# Patient Record
Sex: Female | Born: 1985 | Race: Asian | Hispanic: No | Marital: Single | Smoking: Never smoker
Health system: Southern US, Community
[De-identification: ages and names within clinical notes are randomized; demographics above are authoritative.]

## PROBLEM LIST (undated history)

## (undated) DIAGNOSIS — B002 Herpesviral gingivostomatitis and pharyngotonsillitis: Secondary | ICD-10-CM

## (undated) HISTORY — DX: Herpesviral gingivostomatitis and pharyngotonsillitis: B00.2

---

## 2007-12-31 ENCOUNTER — Ambulatory Visit (HOSPITAL_COMMUNITY): Admission: RE | Admit: 2007-12-31 | Discharge: 2007-12-31 | Payer: Self-pay | Admitting: Occupational Medicine

## 2008-10-27 DIAGNOSIS — B002 Herpesviral gingivostomatitis and pharyngotonsillitis: Secondary | ICD-10-CM

## 2008-10-27 HISTORY — DX: Herpesviral gingivostomatitis and pharyngotonsillitis: B00.2

## 2009-03-24 IMAGING — CR DG CHEST 2V
2 series · 2 of 2 positions shown · non-contrast
Comparison: None.

CLINICAL DATA: Positive PPD

[view not recorded (1 of 2)]
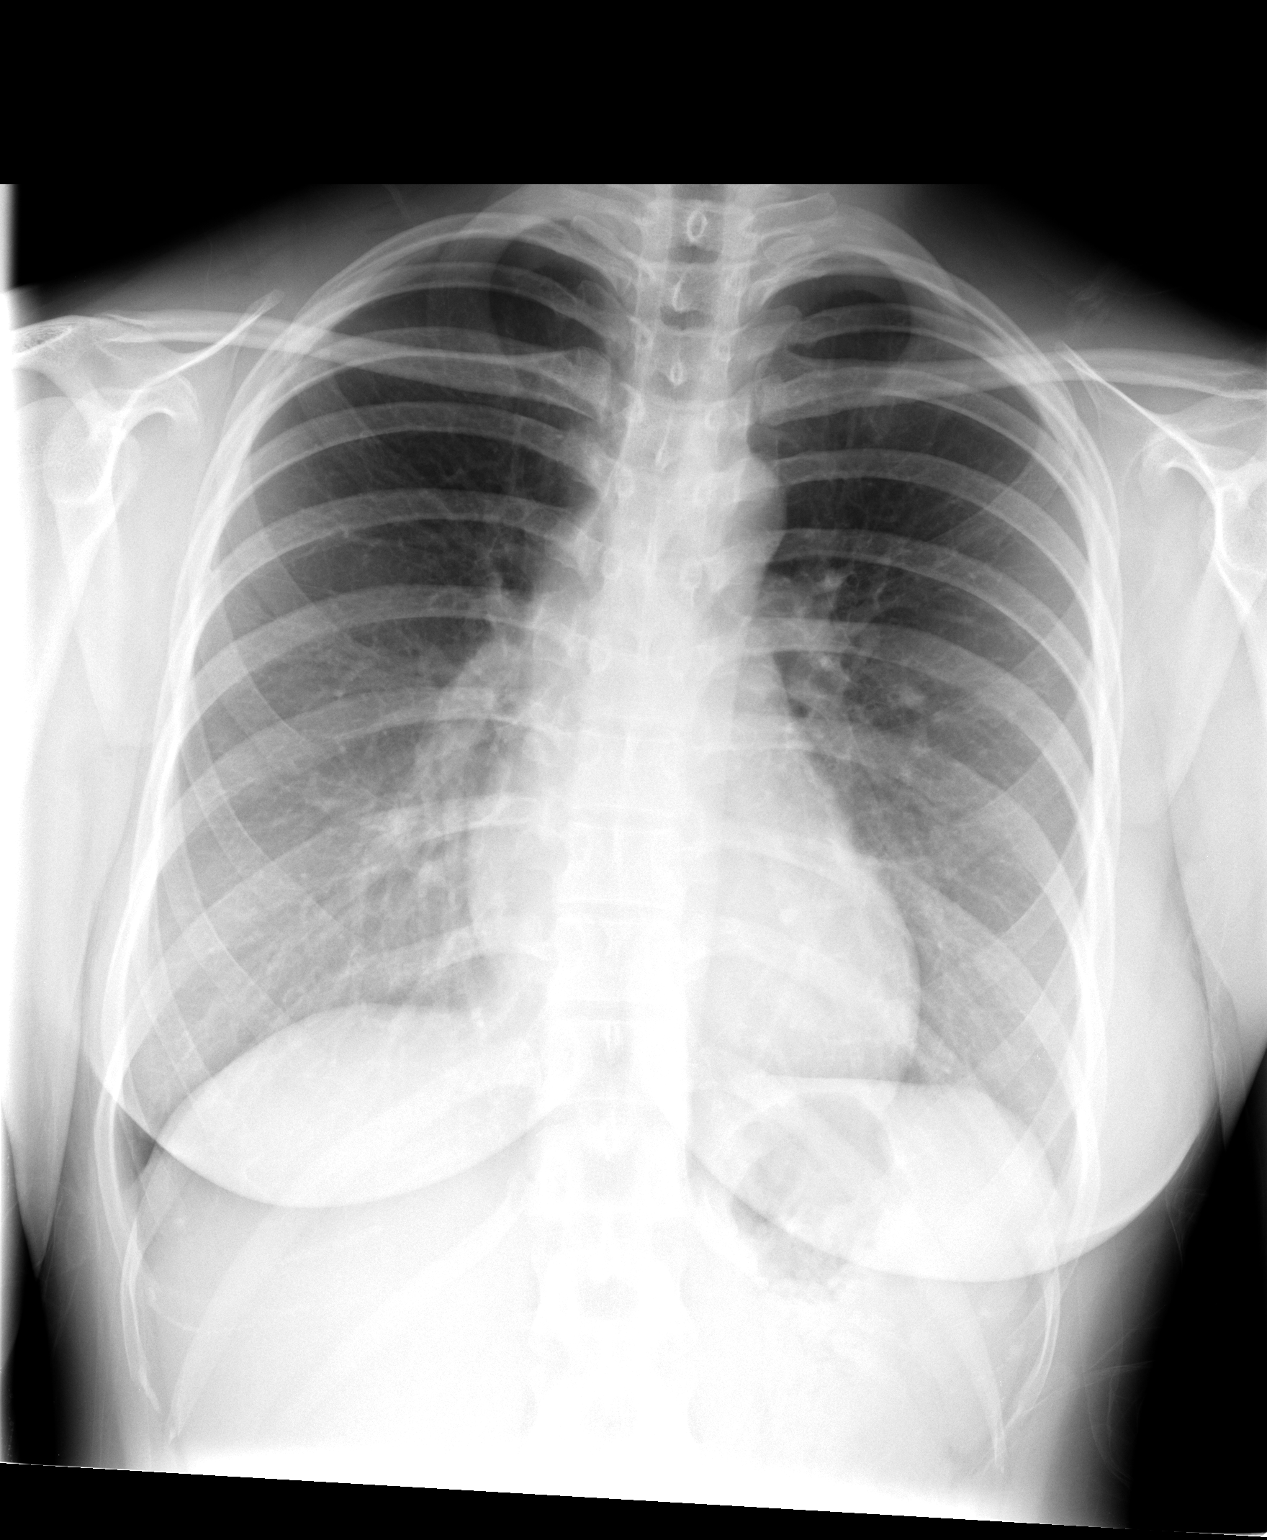

[view not recorded (2 of 2)]
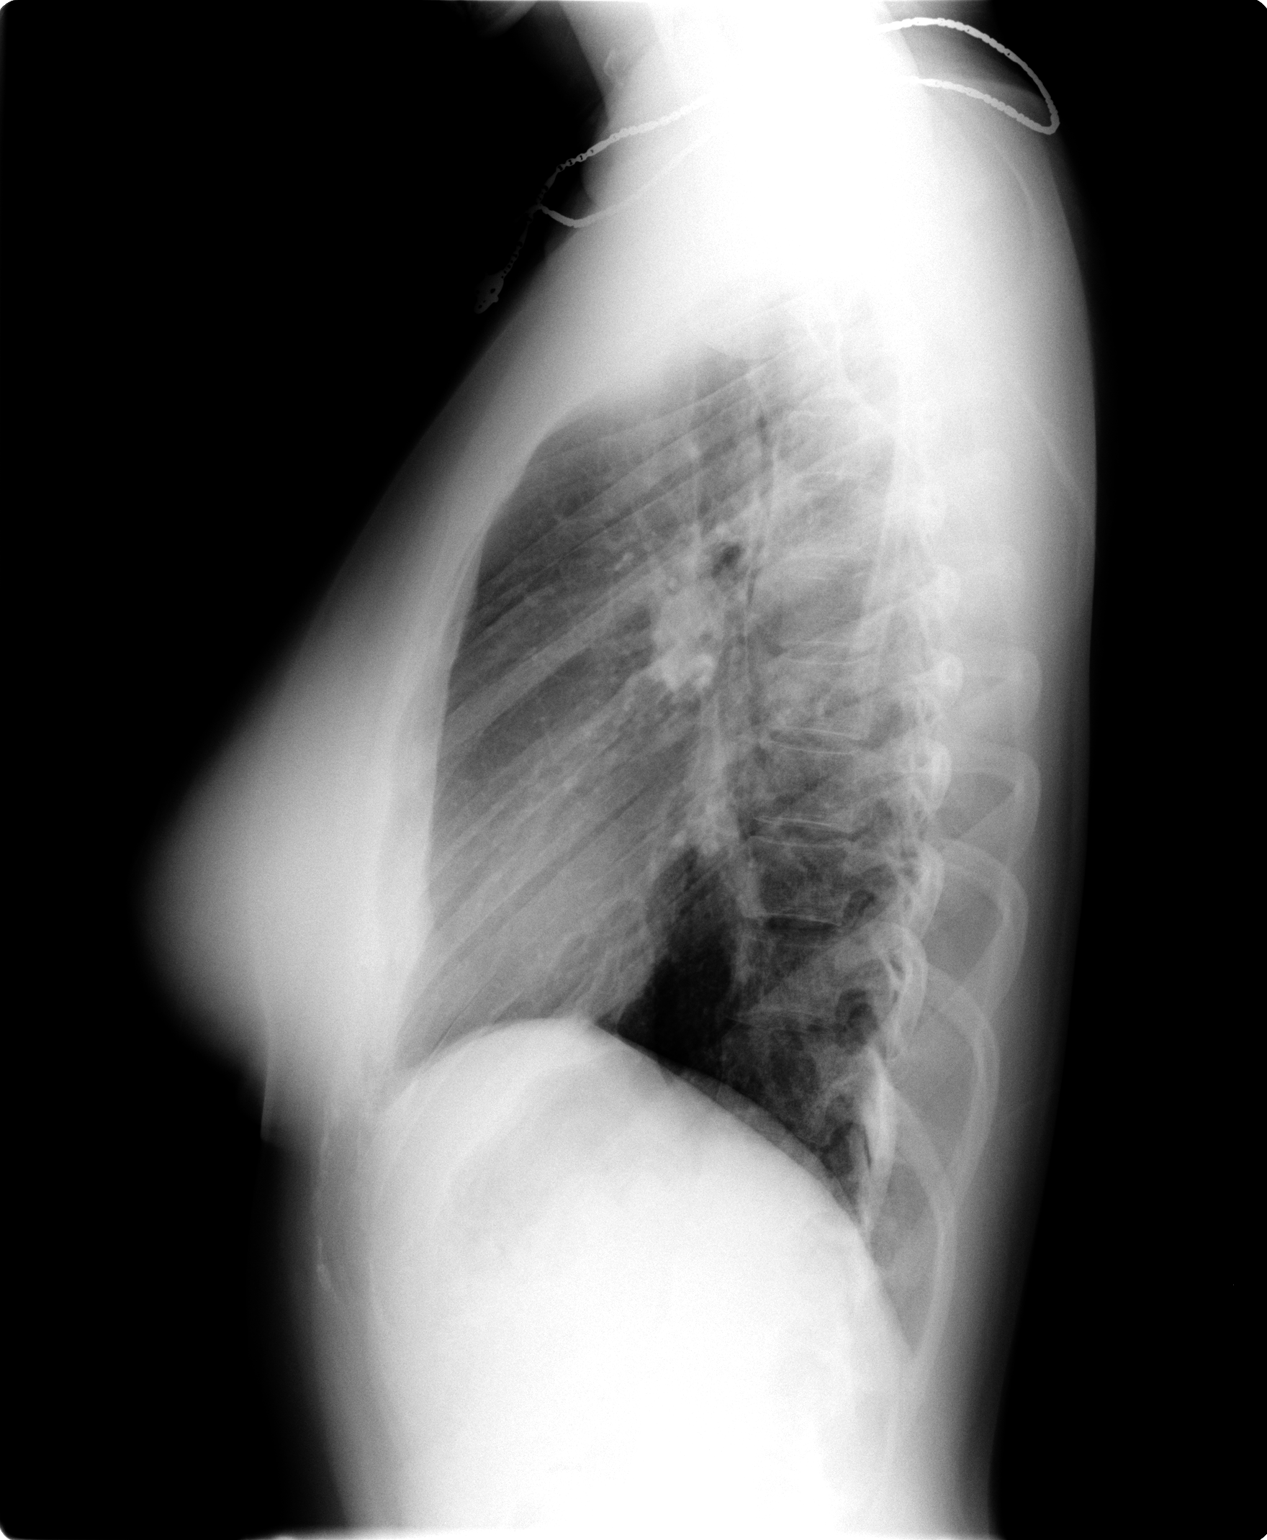

[2 of 2 positions shown; findings below may reference images not displayed]

CHEST - 2 VIEW:

The lungs are clear.  The cardiopericardial silhouette is within normal limits
for size.  Visualized bony structures of the thorax are intact.
IMPRESSION: No acute cardiopulmonary process

## 2010-04-15 ENCOUNTER — Ambulatory Visit: Payer: Self-pay | Admitting: Internal Medicine

## 2010-04-15 DIAGNOSIS — J069 Acute upper respiratory infection, unspecified: Secondary | ICD-10-CM | POA: Insufficient documentation

## 2010-04-16 ENCOUNTER — Telehealth: Payer: Self-pay | Admitting: Internal Medicine

## 2010-07-08 ENCOUNTER — Telehealth: Payer: Self-pay | Admitting: Internal Medicine

## 2010-11-26 NOTE — Progress Notes (Signed)
       New/Updated Medications: ACYCLOVIR 800 MG TABS (ACYCLOVIR) One three times a day for 10 days Prescriptions: ACYCLOVIR 800 MG TABS (ACYCLOVIR) One three times a day for 10 days  #30 x 11   Entered and Authorized by:   Etta Grandchild MD   Signed by:   Etta Grandchild MD on 07/08/2010   Method used:   Electronically to        [facility] [address] (retail)       [phone]       [place], [place]  [address]       Ph: [phone]       Fax: [phone]   RxID:   [id number]

## 2010-11-26 NOTE — Progress Notes (Signed)
  Phone Note Call from Patient   Caller: Patient Summary of Call: Patient c/o continued congestion, stuffiness, when now yellow mucus. She has taken zrytec and had steroid injection at last ov. PLease advise.Marland KitchenMarland KitchenMarland KitchenAlvy Beal Archie CMA  April 16, 2010 10:51 AM   Follow-up for Phone Call        Pt notified.Alvy Beal Archie CMA  April 16, 2010 12:52 PM     New/Updated Medications: AVELOX 400 MG TABS (MOXIFLOXACIN HCL) One by mouth once daily for 7 days Prescriptions: AVELOX 400 MG TABS (MOXIFLOXACIN HCL) One by mouth once daily for 7 days  #7 x 0   Entered and Authorized by:   Etta Grandchild MD   Signed by:   Etta Grandchild MD on 04/16/2010   Method used:   Samples Given   RxID:   [id number]

## 2010-11-26 NOTE — Assessment & Plan Note (Signed)
Summary: CONGESTION/NWS   Vital Signs:  Patient profile:   25 year old female Height:      62 inches Weight:      139.50 pounds BMI:     25.61 O2 Sat:      99 % on Room air Temp:     98.0 degrees F oral Pulse rate:   87 / minute Pulse rhythm:   regular Resp:     16 per minute BP sitting:   128 / 80  (left arm) Cuff size:   large  Vitals Entered By: Rock Nephew CMA (April 15, 2010 1:05 PM)  Nutrition Counseling: Patient's BMI is greater than 25 and therefore counseled on weight management options.  O2 Flow:  Room air CC: congestion, stuffy nose, Bilateral ear pressure x 04/12/2010, URI symptoms Is Patient Diabetic? No Pain Assessment Patient in pain? no        Primary Care Provider:  Etta Grandchild MD  CC:  congestion, stuffy nose, Bilateral ear pressure x 04/12/2010, and URI symptoms.  History of Present Illness:  URI Symptoms      This is a right-handed patient who presents with URI symptoms.  The symptoms began 4 days ago.  The severity is described as moderate.  The patient reports nasal congestion and clear nasal discharge, but denies purulent nasal discharge, sore throat, dry cough, productive cough, earache, and sick contacts.  The patient denies fever, stiff neck, dyspnea, wheezing, rash, vomiting, diarrhea, use of an antipyretic, and response to antipyretic.  The patient also reports itchy throat and sneezing.  The patient denies headache, muscle aches, and severe fatigue.  The patient denies the following risk factors for Strep sinusitis: unilateral facial pain, unilateral nasal discharge, poor response to decongestant, double sickening, tooth pain, Strep exposure, tender adenopathy, and absence of cough.    Preventive Screening-Counseling & Management  Alcohol-Tobacco     Alcohol drinks/day: 0     Smoking Status: never  Caffeine-Diet-Exercise     Does Patient Exercise: no  Hep-HIV-STD-Contraception     Hepatitis Risk: no risk noted     HIV Risk: no risk  noted     STD Risk: no risk noted      Sexual History:  currently monogamous.        Drug Use:  no.        Blood Transfusions:  no.    Medications Prior to Update: 1)  None  Current Medications (verified): 1)  Loestrin 24 Fe 1-20 Mg-Mcg Tabs (Norethin Ace-Eth Estrad-Fe) 2)  Zyrtec-D Allergy & Congestion 5-120 Mg Xr12h-Tab (Cetirizine-Pseudoephedrine) .... One By Mouth Two Times A Day As Needed For Nasal Congestion  Allergies (verified): No Known Drug Allergies  Past History:  Past Medical History: Unremarkable  Past Surgical History: Denies surgical history  Family History: none  Social History: Occupation: L[employer] Single Never Smoked Alcohol use-no Drug use-no Regular exercise-no Smoking Status:  never Hepatitis Risk:  no risk noted HIV Risk:  no risk noted STD Risk:  no risk noted Sexual History:  currently monogamous Blood Transfusions:  no Drug Use:  no Does Patient Exercise:  no  Review of Systems  The patient denies anorexia, fever, weight loss, decreased hearing, hoarseness, chest pain, peripheral edema, prolonged cough, headaches, hemoptysis, abdominal pain, suspicious skin lesions, and enlarged lymph nodes.    Physical Exam  General:  alert, well-developed, well-nourished, well-hydrated, appropriate dress, normal appearance, healthy-appearing, cooperative to examination, and good hygiene.   Head:  normocephalic, atraumatic, no abnormalities observed, and no  abnormalities palpated.   Eyes:  vision grossly intact, no injection, and no nystagmus.   Ears:  R ear normal and L ear normal.   Nose:  no external deformity, no nasal discharge, no airflow obstruction, no intranasal foreign body, no nasal polyps, no nasal mucosal lesions, no mucosal friability, no active bleeding or clots, no sinus percussion tenderness, no septum abnormalities, mucosal erythema, and mucosal edema.   Mouth:  Oral mucosa and oropharynx without lesions or exudates.  Teeth in good  repair. Neck:  supple, full ROM, no masses, no thyromegaly, no thyroid nodules or tenderness, no JVD, normal carotid upstroke, no carotid bruits, no cervical lymphadenopathy, and no neck tenderness.   Lungs:  normal respiratory effort, no intercostal retractions, no accessory muscle use, normal breath sounds, no dullness, no fremitus, no crackles, and no wheezes.   Heart:  normal rate, regular rhythm, no murmur, no gallop, no rub, and no JVD.   Abdomen:  soft, non-tender, normal bowel sounds, no distention, no masses, no guarding, no rigidity, no rebound tenderness, no abdominal hernia, no inguinal hernia, no hepatomegaly, and no splenomegaly.   Msk:  normal ROM, no joint tenderness, no joint swelling, no joint warmth, no redness over joints, no joint deformities, no joint instability, no crepitation, and no muscle atrophy.   Pulses:  R and L carotid,radial,femoral,dorsalis pedis and posterior tibial pulses are full and equal bilaterally Extremities:  No clubbing, cyanosis, edema, or deformity noted with normal full range of motion of all joints.   Neurologic:  No cranial nerve deficits noted. Station and gait are normal. Plantar reflexes are down-going bilaterally. DTRs are symmetrical throughout. Sensory, motor and coordinative functions appear intact. Skin:  turgor normal, color normal, no rashes, no suspicious lesions, no ecchymoses, no petechiae, no purpura, no ulcerations, and no edema.   Cervical Nodes:  no anterior cervical adenopathy and no posterior cervical adenopathy.   Axillary Nodes:  no R axillary adenopathy and no L axillary adenopathy.   Inguinal Nodes:  no R inguinal adenopathy and no L inguinal adenopathy.   Psych:  Cognition and judgment appear intact. Alert and cooperative with normal attention span and concentration. No apparent delusions, illusions, hallucinations   Impression & Recommendations:  Problem # 1:  URI (ICD-465.9) Assessment New  this sounds viral so will treat  symptomatically Her updated medication list for this problem includes:    Zyrtec-d Allergy & Congestion 5-120 Mg Xr12h-tab (Cetirizine-pseudoephedrine) ..... One by mouth two times a day as needed for nasal congestion  Instructed on symptomatic treatment. Call if symptoms persist or worsen.   Complete Medication List: 1)  Loestrin 24 Fe 1-20 Mg-mcg Tabs (Norethin ace-eth estrad-fe) 2)  Zyrtec-d Allergy & Congestion 5-120 Mg Xr12h-tab (Cetirizine-pseudoephedrine) .... One by mouth two times a day as needed for nasal congestion  Patient Instructions: 1)  Please schedule a follow-up appointment in 2 weeks. 2)  Get plenty of rest, drink lots of clear liquids, and use Tylenol or Ibuprofen for fever and comfort. Return in 7-10 days if you're not better:sooner if you're feeling worse. 3)  Take over the counter cough and cold medications only as directed on the package insert. DO NOT take more than recommended . Prescriptions: ZYRTEC-D ALLERGY & CONGESTION 5-120 MG XR12H-TAB (CETIRIZINE-PSEUDOEPHEDRINE) One by mouth two times a day as needed for nasal congestion  #60 x 1   Entered and Authorized by:   Etta Grandchild MD   Signed by:   Etta Grandchild MD on 04/15/2010   Method  used:   Print then Give to Patient   RxID:   [id number]

## 2011-04-01 LAB — HM PAP SMEAR: HM Pap smear: NORMAL

## 2011-06-19 ENCOUNTER — Encounter: Payer: Self-pay | Admitting: Internal Medicine

## 2011-06-19 ENCOUNTER — Ambulatory Visit (INDEPENDENT_AMBULATORY_CARE_PROVIDER_SITE_OTHER): Payer: 59 | Admitting: Internal Medicine

## 2011-06-19 VITALS — BP 130/80 | HR 84 | Temp 98.6°F | Resp 16 | Wt 143.0 lb

## 2011-06-19 DIAGNOSIS — J069 Acute upper respiratory infection, unspecified: Secondary | ICD-10-CM

## 2011-06-19 MED ORDER — AZITHROMYCIN 250 MG PO TABS: 250.0000 mg | ORAL_TABLET | Freq: Every day | ORAL | Status: AC

## 2011-06-19 MED ORDER — AZITHROMYCIN 250 MG PO TABS: 250.0000 mg | ORAL_TABLET | Freq: Every day | ORAL | Status: DC

## 2011-06-19 NOTE — Progress Notes (Signed)
  Subjective:    Patient ID: Dawn Mathis, female    DOB: [birth year], 25 y.o.   MRN: [id number]  HPI  C/o URI sx's x 2 d. Coughing up brown sputum.  Review of Systems  Constitutional: Positive for fever, chills and fatigue.  Respiratory: Positive for cough and shortness of breath.   Cardiovascular: Negative for chest pain.  Gastrointestinal: Negative for abdominal pain.       Objective:   Physical Exam  Constitutional:       NAD  HENT:  Mouth/Throat: No oropharyngeal exudate.       Eryth throat Fluid behind B TMs  Eyes: Right eye exhibits no discharge. Left eye exhibits no discharge.  Cardiovascular: Normal rate and regular rhythm.   Pulmonary/Chest: No respiratory distress. She has no wheezes. She has no rales. She exhibits no tenderness.          Assessment & Plan:

## 2011-06-19 NOTE — Assessment & Plan Note (Signed)
Zpac 

## 2011-06-19 NOTE — Progress Notes (Signed)
Addended by: Vernie Murders on: 06/19/2011 02:28 PM   Modules accepted: Orders

## 2011-06-19 NOTE — Patient Instructions (Signed)

## 2011-11-04 ENCOUNTER — Ambulatory Visit (INDEPENDENT_AMBULATORY_CARE_PROVIDER_SITE_OTHER): Payer: 59 | Admitting: *Deleted

## 2011-11-04 DIAGNOSIS — Z23 Encounter for immunization: Secondary | ICD-10-CM

## 2011-12-30 ENCOUNTER — Encounter: Payer: Self-pay | Admitting: Internal Medicine

## 2011-12-30 ENCOUNTER — Ambulatory Visit (INDEPENDENT_AMBULATORY_CARE_PROVIDER_SITE_OTHER): Payer: 59 | Admitting: Internal Medicine

## 2011-12-30 VITALS — BP 118/70 | HR 92 | Temp 97.1°F | Resp 16

## 2011-12-30 DIAGNOSIS — B009 Herpesviral infection, unspecified: Secondary | ICD-10-CM

## 2011-12-30 DIAGNOSIS — J069 Acute upper respiratory infection, unspecified: Secondary | ICD-10-CM

## 2011-12-30 MED ORDER — AZITHROMYCIN 500 MG PO TABS: 500.0000 mg | ORAL_TABLET | Freq: Every day | ORAL | Status: AC

## 2011-12-30 MED ORDER — HYDROCOD POLST-CPM POLST ER 10-8 MG PO CP12: 1.0000 | ORAL_CAPSULE | Freq: Two times a day (BID) | ORAL | Status: DC | PRN

## 2011-12-30 MED ORDER — ACYCLOVIR 800 MG PO TABS: 800.0000 mg | ORAL_TABLET | Freq: Two times a day (BID) | ORAL | Status: DC

## 2011-12-30 NOTE — Progress Notes (Signed)
  Subjective:    Patient ID: Dawn Mathis, female    DOB: [birth year], 26 y.o.   MRN: [id number]  Cough This is a recurrent problem. The current episode started 1 to 4 weeks ago. The problem has been gradually worsening. The problem occurs every few hours. The cough is productive of purulent sputum. Associated symptoms include nasal congestion, postnasal drip, rhinorrhea and a sore throat. Pertinent negatives include no chest pain, chills, ear congestion, ear pain, fever, headaches, heartburn, hemoptysis, myalgias, rash, shortness of breath, sweats, weight loss or wheezing. She has tried OTC cough suppressant for the symptoms. The treatment provided moderate relief.      Review of Systems  Constitutional: Negative for fever, chills, weight loss, diaphoresis, activity change, appetite change, fatigue and unexpected weight change.  HENT: Positive for congestion, sore throat, rhinorrhea, sneezing and postnasal drip. Negative for hearing loss, ear pain, nosebleeds, facial swelling, drooling, mouth sores, trouble swallowing, neck pain, neck stiffness, dental problem, voice change, sinus pressure, tinnitus and ear discharge.   Eyes: Negative.   Respiratory: Positive for cough. Negative for apnea, hemoptysis, choking, chest tightness, shortness of breath and wheezing.   Cardiovascular: Negative for chest pain, palpitations and leg swelling.  Gastrointestinal: Negative.  Negative for heartburn.  Genitourinary: Negative.   Musculoskeletal: Negative.  Negative for myalgias.  Skin: Negative.  Negative for rash.  Neurological: Negative.  Negative for headaches.  Hematological: Negative for adenopathy. Does not bruise/bleed easily.  Psychiatric/Behavioral: Negative.        Objective:   Physical Exam  Vitals reviewed. Constitutional: She is oriented to person, place, and time. She appears well-developed and well-nourished. No distress.  HENT:  Head: Normocephalic and atraumatic. No trismus in  the jaw.  Right Ear: Hearing, tympanic membrane, external ear and ear canal normal.  Left Ear: Hearing, tympanic membrane, external ear and ear canal normal.  Nose: Mucosal edema and rhinorrhea present. No nose lacerations, sinus tenderness, nasal deformity, septal deviation or nasal septal hematoma. No epistaxis.  No foreign bodies. Right sinus exhibits no maxillary sinus tenderness and no frontal sinus tenderness. Left sinus exhibits no maxillary sinus tenderness and no frontal sinus tenderness.  Mouth/Throat: Oropharynx is clear and moist and mucous membranes are normal. Mucous membranes are not pale, not dry and not cyanotic. No uvula swelling. No oropharyngeal exudate, posterior oropharyngeal edema, posterior oropharyngeal erythema or tonsillar abscesses.  Eyes: Conjunctivae are normal. Right eye exhibits no discharge. Left eye exhibits no discharge. No scleral icterus.  Neck: Normal range of motion. Neck supple. No JVD present. No tracheal deviation present. No thyromegaly present.  Cardiovascular: Normal rate, regular rhythm, normal heart sounds and intact distal pulses.  Exam reveals no gallop and no friction rub.   No murmur heard. Pulmonary/Chest: Effort normal and breath sounds normal. No stridor. No respiratory distress. She has no wheezes. She has no rales. She exhibits no tenderness.  Abdominal: Soft. Bowel sounds are normal. She exhibits no distension and no mass. There is no tenderness. There is no rebound and no guarding.  Musculoskeletal: Normal range of motion. She exhibits no edema.  Lymphadenopathy:    She has no cervical adenopathy.  Neurological: She is oriented to person, place, and time.  Skin: Skin is warm and dry. No rash noted. She is not diaphoretic. No erythema. No pallor.  Psychiatric: She has a normal mood and affect. Her behavior is normal. Judgment and thought content normal.          Assessment & Plan:

## 2011-12-30 NOTE — Patient Instructions (Signed)

## 2011-12-30 NOTE — Assessment & Plan Note (Signed)
Start zpak for the infection and a cough suppressant 

## 2012-01-20 ENCOUNTER — Telehealth: Payer: Self-pay | Admitting: Internal Medicine

## 2012-01-20 ENCOUNTER — Ambulatory Visit (INDEPENDENT_AMBULATORY_CARE_PROVIDER_SITE_OTHER)
Admission: RE | Admit: 2012-01-20 | Discharge: 2012-01-20 | Disposition: A | Discharge status: Home / Self Care | Payer: 59 | Source: Ambulatory Visit | Attending: Internal Medicine | Admitting: Internal Medicine

## 2012-01-20 ENCOUNTER — Telehealth: Payer: Self-pay | Admitting: *Deleted

## 2012-01-20 DIAGNOSIS — Z111 Encounter for screening for respiratory tuberculosis: Secondary | ICD-10-CM

## 2012-01-20 NOTE — Telephone Encounter (Signed)
Per Dr. Posey Rea- pt needs CXR. Order entered.

## 2012-01-20 NOTE — Telephone Encounter (Signed)
Stacey, please, inform - nl CXR Thx

## 2012-01-21 NOTE — Telephone Encounter (Signed)
Pt informed

## 2012-12-11 ENCOUNTER — Other Ambulatory Visit: Payer: Self-pay

## 2013-04-07 ENCOUNTER — Ambulatory Visit (INDEPENDENT_AMBULATORY_CARE_PROVIDER_SITE_OTHER): Payer: 59

## 2013-04-07 DIAGNOSIS — Z23 Encounter for immunization: Secondary | ICD-10-CM

## 2013-04-13 IMAGING — CR DG CHEST 2V
2 series · 2 of 2 positions shown · non-contrast
Comparison: 12/31/2007

CLINICAL DATA: Screening for TB

CHEST - 2 VIEW

[view not recorded (1 of 2)]
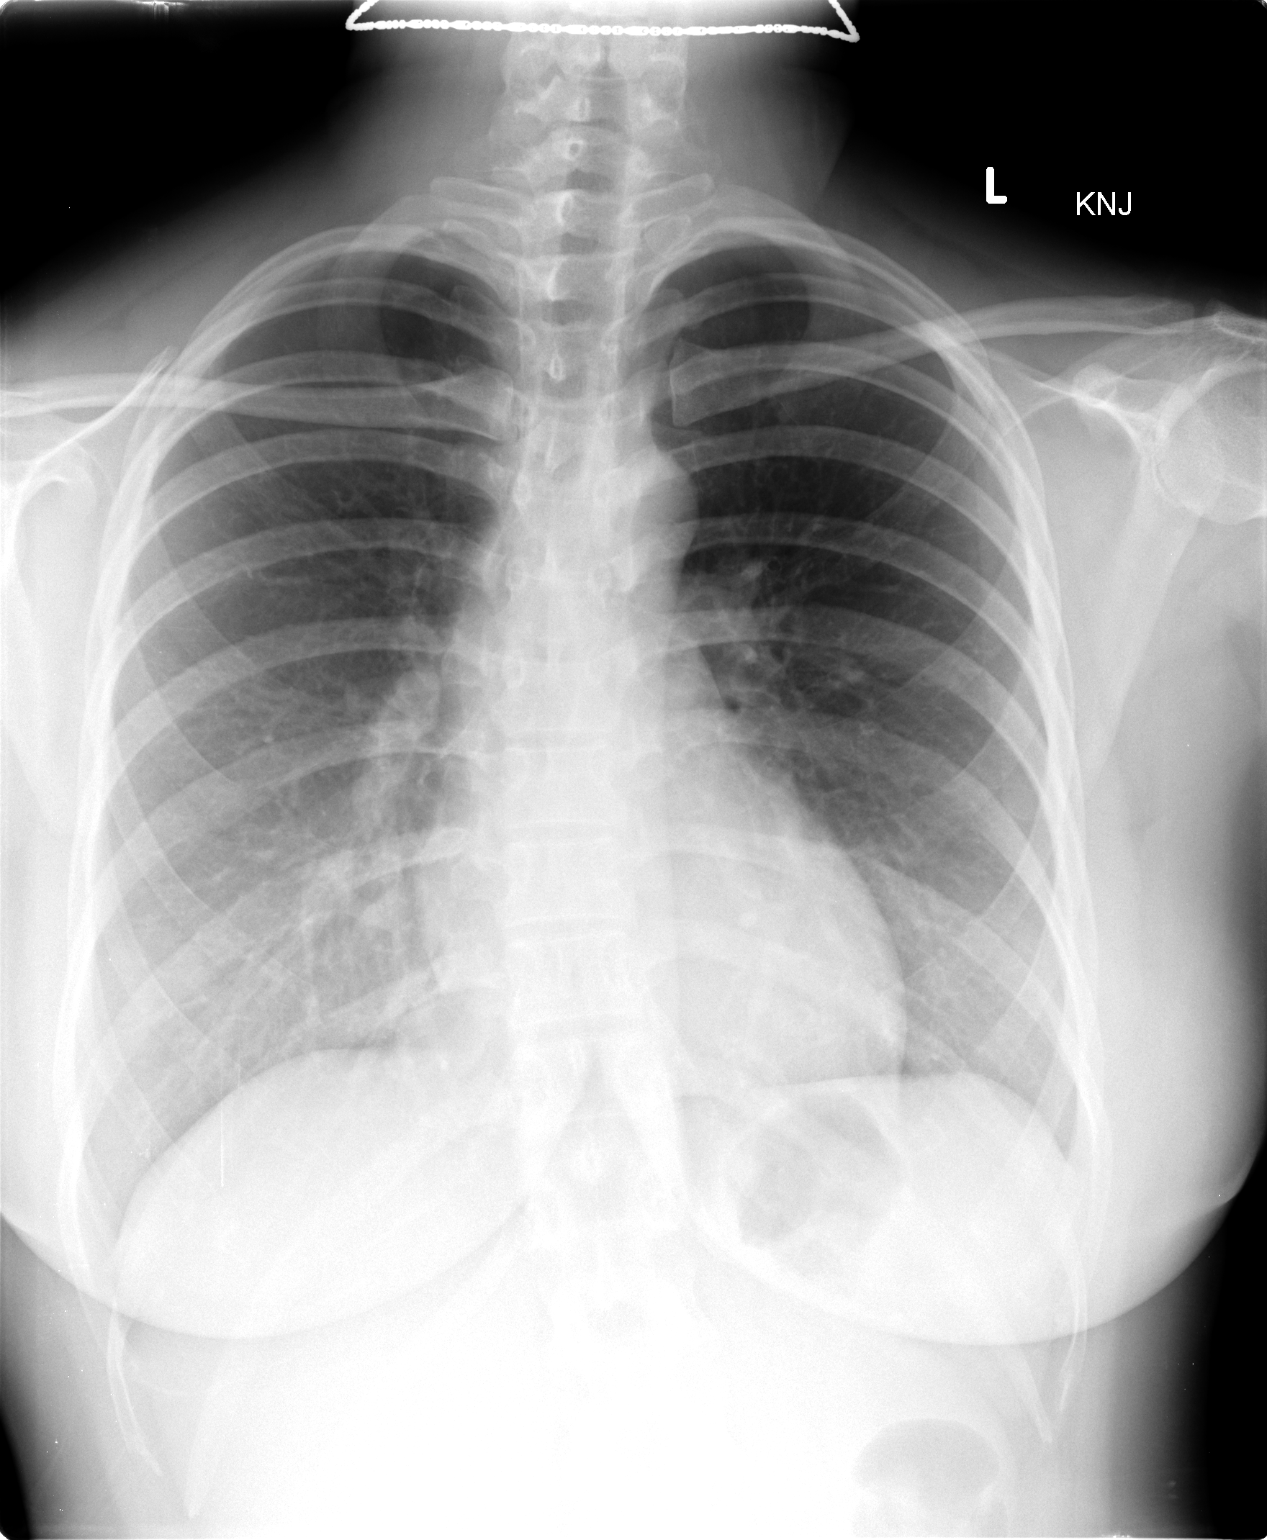

[view not recorded (2 of 2)]
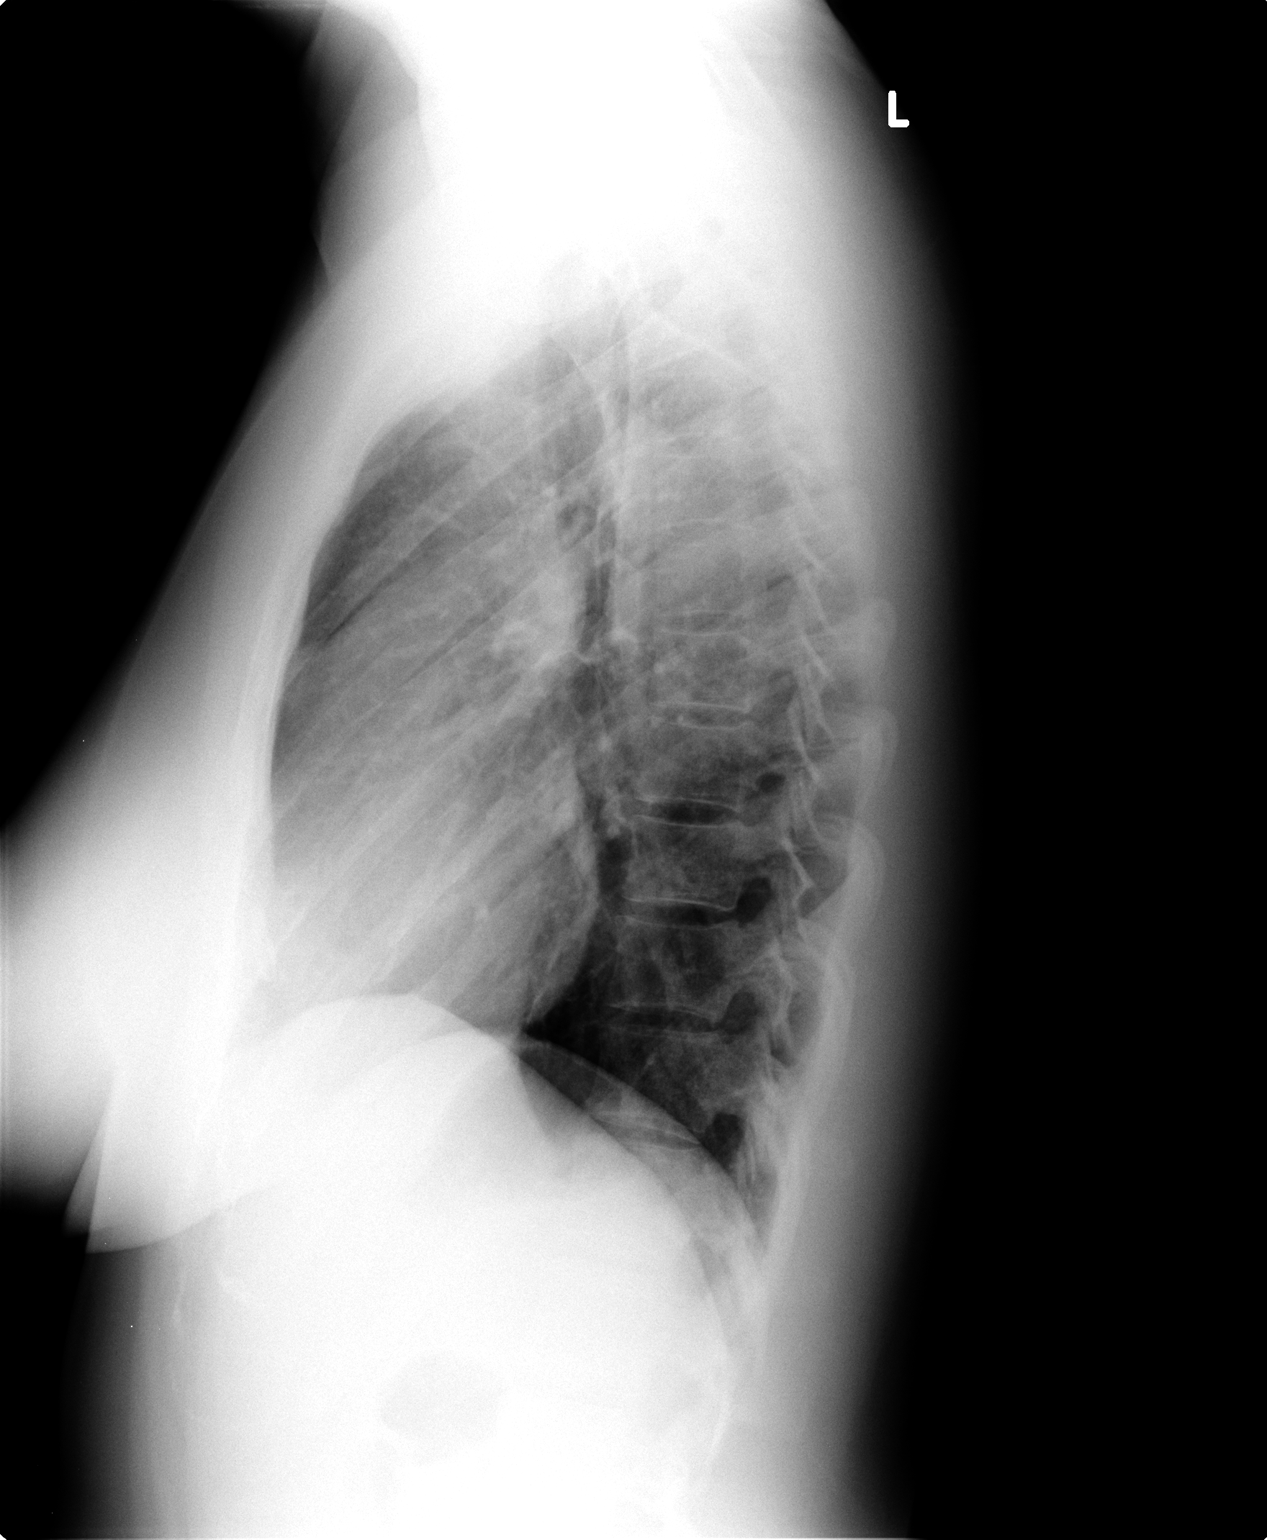

[2 of 2 positions shown; findings below may reference images not displayed]

FINDINGS: The heart size and mediastinal contours are within normal
limits.  Both lungs are clear.  The visualized skeletal structures
are unremarkable.
IMPRESSION: Negative exam.

## 2013-06-08 ENCOUNTER — Other Ambulatory Visit: Payer: Self-pay | Admitting: Internal Medicine

## 2013-06-08 DIAGNOSIS — B009 Herpesviral infection, unspecified: Secondary | ICD-10-CM

## 2013-06-08 MED ORDER — ACYCLOVIR 800 MG PO TABS: 800.0000 mg | ORAL_TABLET | Freq: Two times a day (BID) | ORAL | Status: DC

## 2013-09-01 ENCOUNTER — Other Ambulatory Visit: Payer: Self-pay

## 2013-10-13 ENCOUNTER — Other Ambulatory Visit: Payer: Self-pay | Admitting: *Deleted

## 2013-10-13 DIAGNOSIS — Z0000 Encounter for general adult medical examination without abnormal findings: Secondary | ICD-10-CM

## 2013-10-25 ENCOUNTER — Other Ambulatory Visit: Payer: Self-pay | Admitting: Internal Medicine

## 2013-10-25 ENCOUNTER — Other Ambulatory Visit (INDEPENDENT_AMBULATORY_CARE_PROVIDER_SITE_OTHER): Payer: 59

## 2013-10-25 DIAGNOSIS — Z0000 Encounter for general adult medical examination without abnormal findings: Secondary | ICD-10-CM

## 2013-10-25 LAB — HEPATIC FUNCTION PANEL
Alkaline Phosphatase: 27 U/L — ABNORMAL LOW (ref 39–117)
Bilirubin, Direct: 0.1 mg/dL (ref 0.0–0.3)
Total Protein: 7.6 g/dL (ref 6.0–8.3)

## 2013-10-25 LAB — URINALYSIS, ROUTINE W REFLEX MICROSCOPIC
Ketones, ur: NEGATIVE
Specific Gravity, Urine: >=1.03 — AB (ref 1.000–1.030)
Urine Glucose: NEGATIVE
Urobilinogen, UA: 0.2 (ref 0.0–1.0)
pH: 5.5 (ref 5.0–8.0)

## 2013-10-25 LAB — BASIC METABOLIC PANEL
CO2: 25 mEq/L (ref 19–32)
Calcium: 9.2 mg/dL (ref 8.4–10.5)
Creatinine, Ser: 0.7 mg/dL (ref 0.4–1.2)
GFR: 108.62 mL/min (ref >60.00)
Sodium: 138 mEq/L (ref 135–145)

## 2013-10-25 LAB — LIPID PANEL
HDL: 42.8 mg/dL (ref >39.00)
LDL Cholesterol: 112 mg/dL — ABNORMAL HIGH (ref 0–99)
Total CHOL/HDL Ratio: 4
Triglycerides: 181 mg/dL — ABNORMAL HIGH (ref 0.0–149.0)
VLDL: 36.2 mg/dL (ref 0.0–40.0)

## 2013-10-25 LAB — CBC WITH DIFFERENTIAL/PLATELET
Basophils Relative: 0.5 % (ref 0.0–3.0)
Eosinophils Relative: 2.2 % (ref 0.0–5.0)
Hemoglobin: 13.7 g/dL (ref 12.0–15.0)
Lymphocytes Relative: 38.2 % (ref 12.0–46.0)
MCHC: 33.2 g/dL (ref 30.0–36.0)
Monocytes Relative: 5.5 % (ref 3.0–12.0)
Neutro Abs: 6.1 10*3/uL (ref 1.4–7.7)
Neutrophils Relative %: 53.6 % (ref 43.0–77.0)
RBC: 5.38 Mil/uL — ABNORMAL HIGH (ref 3.87–5.11)
WBC: 11.5 10*3/uL — ABNORMAL HIGH (ref 4.5–10.5)

## 2013-10-25 LAB — TSH: TSH: 2.26 u[IU]/mL (ref 0.35–5.50)

## 2013-10-25 MED ORDER — CEFUROXIME AXETIL 250 MG PO TABS: 250.0000 mg | ORAL_TABLET | Freq: Two times a day (BID) | ORAL | Status: DC

## 2014-03-06 ENCOUNTER — Ambulatory Visit (INDEPENDENT_AMBULATORY_CARE_PROVIDER_SITE_OTHER): Payer: 59

## 2014-03-06 DIAGNOSIS — Z23 Encounter for immunization: Secondary | ICD-10-CM

## 2014-03-16 ENCOUNTER — Telehealth: Payer: Self-pay | Admitting: *Deleted

## 2014-03-16 NOTE — Telephone Encounter (Signed)
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 Form given to MD in red folder for completion. Letter attached.

## 2014-03-17 NOTE — Telephone Encounter (Signed)
Done. Thx.

## 2014-03-21 ENCOUNTER — Ambulatory Visit: Payer: 59

## 2015-03-23 ENCOUNTER — Encounter: Payer: Self-pay | Admitting: Internal Medicine

## 2015-05-21 ENCOUNTER — Other Ambulatory Visit: Payer: Self-pay | Admitting: Internal Medicine

## 2015-05-21 DIAGNOSIS — B009 Herpesviral infection, unspecified: Secondary | ICD-10-CM

## 2015-05-21 MED ORDER — ACYCLOVIR 800 MG PO TABS: 800.0000 mg | ORAL_TABLET | Freq: Two times a day (BID) | ORAL | Status: DC

## 2015-05-29 ENCOUNTER — Encounter: Payer: 59 | Admitting: Internal Medicine

## 2015-12-18 DIAGNOSIS — Z01419 Encounter for gynecological examination (general) (routine) without abnormal findings: Secondary | ICD-10-CM | POA: Diagnosis not present

## 2016-02-27 ENCOUNTER — Encounter: Payer: Self-pay | Admitting: *Deleted

## 2016-06-03 ENCOUNTER — Encounter: Payer: Self-pay | Admitting: Internal Medicine

## 2016-07-14 DIAGNOSIS — H5213 Myopia, bilateral: Secondary | ICD-10-CM | POA: Diagnosis not present

## 2016-11-04 ENCOUNTER — Encounter: Payer: Self-pay | Admitting: Family

## 2016-11-04 ENCOUNTER — Ambulatory Visit (INDEPENDENT_AMBULATORY_CARE_PROVIDER_SITE_OTHER): Payer: 59 | Admitting: Family

## 2016-11-04 VITALS — BP 194/120 | HR 106 | Temp 98.3°F | Resp 16 | Ht 62.0 in | Wt 177.6 lb

## 2016-11-04 DIAGNOSIS — R030 Elevated blood-pressure reading, without diagnosis of hypertension: Secondary | ICD-10-CM | POA: Diagnosis not present

## 2016-11-04 DIAGNOSIS — J329 Chronic sinusitis, unspecified: Secondary | ICD-10-CM | POA: Insufficient documentation

## 2016-11-04 DIAGNOSIS — J0100 Acute maxillary sinusitis, unspecified: Secondary | ICD-10-CM | POA: Diagnosis not present

## 2016-11-04 MED ORDER — METHYLPREDNISOLONE ACETATE 80 MG/ML IJ SUSP
80.0000 mg | Freq: Once | INTRAMUSCULAR | Status: AC
  Administered 2016-11-04: 80 mg via INTRAMUSCULAR

## 2016-11-04 MED ORDER — PROMETHAZINE-CODEINE 6.25-10 MG/5ML PO SYRP: 5.0000 mL | ORAL_SOLUTION | Freq: Four times a day (QID) | ORAL | 0 refills | Status: DC | PRN

## 2016-11-04 MED ORDER — AMLODIPINE BESYLATE 10 MG PO TABS: 5.0000 mg | ORAL_TABLET | Freq: Every day | ORAL | 0 refills | Status: DC

## 2016-11-04 MED ORDER — AMOXICILLIN-POT CLAVULANATE 875-125 MG PO TABS: 1.0000 | ORAL_TABLET | Freq: Two times a day (BID) | ORAL | 0 refills | Status: DC

## 2016-11-04 MED FILL — PROMETHAZINE-CODEINE SYRUP: 6.25-10 | 6 days supply | Qty: 120 | Fill #0

## 2016-11-04 MED FILL — AMLODIPINE BESYLATE 10 MG T: 10 | 60 days supply | Qty: 30 | Fill #0

## 2016-11-04 NOTE — Progress Notes (Signed)
Subjective:    Patient ID: Dawn Mathis, female    DOB: Aug 02, 1986, 31 y.o.   MRN: [id number]  Chief Complaint  Patient presents with  . Cough    x4 days, cough, nasal congestion    HPI:  Dawn Mathis is a 31 y.o. female who  has a past medical history of Primary HSV infection of mouth (2010). and presents today for an acute office visit.  This is a new problem. Associated symptoms of cough, congestion, and sinus pressure have been going on for about 5 days. No fevers. Modifying factors include Dayquil, Nyquil and Mucinex which have helped with her symptom management. No recent antibiotics. Overall course of symptoms appears to be slightly improved today.   No Known Allergies    Outpatient Medications Prior to Visit  Medication Sig Dispense Refill  . acyclovir (ZOVIRAX) 800 MG tablet Take 1 tablet (800 mg total) by mouth 2 (two) times daily. 20 tablet 5  . Norethin-Eth Estradiol-Fe (GENERESS FE PO) Take by mouth.     No facility-administered medications prior to visit.       Review of Systems  Constitutional: Negative for chills and fever.  HENT: Positive for congestion.   Respiratory: Positive for cough.       Objective:    BP (!) 194/120 (BP Location: Left Arm, Patient Position: Sitting, Cuff Size: Normal)   Pulse (!) 106   Temp 98.3 F (36.8 C) (Oral)   Resp 16   Ht 5\' 2"  (1.575 m)   Wt 177 lb 9.6 oz (80.6 kg)   SpO2 97%   BMI 32.48 kg/m  Nursing note and vital signs reviewed.  Physical Exam  Constitutional: She is oriented to person, place, and time. She appears well-developed and well-nourished. No distress.  HENT:  Right Ear: Hearing, tympanic membrane, external ear and ear canal normal.  Left Ear: Hearing, tympanic membrane, external ear and ear canal normal.  Nose: Right sinus exhibits maxillary sinus tenderness. Right sinus exhibits no frontal sinus tenderness. Left sinus exhibits maxillary sinus tenderness. Left sinus exhibits no  frontal sinus tenderness.  Mouth/Throat: Uvula is midline, oropharynx is clear and moist and mucous membranes are normal.  Neck: Neck supple.  Cardiovascular: Normal rate, regular rhythm, normal heart sounds and intact distal pulses.   Pulmonary/Chest: Effort normal and breath sounds normal.  Neurological: She is alert and oriented to person, place, and time.  Skin: Skin is warm and dry.  Psychiatric: She has a normal mood and affect. Her behavior is normal. Judgment and thought content normal.       Assessment & Plan:   Problem List Items Addressed This Visit      Respiratory   Sinusitis - Primary    Symptoms and exam consistent with sinusitis with potential for viral infection given most recent improvement. In office injection of depo-medrol provided.  Recommend watchful waiting with written prescription for Augmentin provided if no improvement in the next 3-5 days. Continue over-the-counter medications as needed for symptom relief and supportive care. Follow-up if symptoms worsen or do not improve      Relevant Medications   promethazine-codeine (PHENERGAN WITH CODEINE) 6.25-10 MG/5ML syrup   amoxicillin-clavulanate (AUGMENTIN) 875-125 MG tablet   methylPREDNISolone acetate (DEPO-MEDROL) injection 80 mg (Completed)     Other   Elevated blood pressure reading    This is a new problem. Patient noted to have elevated blood pressure greater than 140/90 with no history of high blood pressure in the past. Given significant elevated start  amlodipine. Advised to decrease sodium in diet with information regarding DASH eating plan provided. Denies worst headache of life with no symptoms of end organ damage. Follow up in 3 days for blood pressure check.       Relevant Medications   amLODipine (NORVASC) 10 MG tablet       I have discontinued Ms. Square's Norethin-Eth Estradiol-Fe (GENERESS FE PO) and acyclovir. I am also having her start on amLODipine, promethazine-codeine, and  amoxicillin-clavulanate. We administered methylPREDNISolone acetate.   Meds ordered this encounter  Medications  . amLODipine (NORVASC) 10 MG tablet    Sig: Take 0.5 tablets (5 mg total) by mouth daily.    Dispense:  30 tablet    Refill:  0    Order Specific Question:   Supervising Provider    Answer:   Pricilla Holm A [id number]  . promethazine-codeine (PHENERGAN WITH CODEINE) 6.25-10 MG/5ML syrup    Sig: Take 5 mLs by mouth every 6 (six) hours as needed for cough.    Dispense:  120 mL    Refill:  0    Order Specific Question:   Supervising Provider    Answer:   Pricilla Holm A [id number]  . amoxicillin-clavulanate (AUGMENTIN) 875-125 MG tablet    Sig: Take 1 tablet by mouth 2 (two) times daily.    Dispense:  14 tablet    Refill:  0    Order Specific Question:   Supervising Provider    Answer:   Pricilla Holm A [id number]  . methylPREDNISolone acetate (DEPO-MEDROL) injection 80 mg     Follow-up: Return in about 3 days (around 11/07/2016).  Mauricio Po, FNP

## 2016-11-04 NOTE — Assessment & Plan Note (Signed)
This is a new problem. Patient noted to have elevated blood pressure greater than 140/90 with no history of high blood pressure in the past. Given significant elevated start amlodipine. Advised to decrease sodium in diet with information regarding DASH eating plan provided. Denies worst headache of life with no symptoms of end organ damage. Follow up in 3 days for blood pressure check.

## 2016-11-04 NOTE — Patient Instructions (Signed)
Thank you for choosing [facility].  SUMMARY AND INSTRUCTIONS:  Please start the Augmentin if your symptoms do not improve.  Monitor your blood pressure at home.   Follow low sodium diet.   Medication:  Your prescription(s) have been submitted to your pharmacy or been printed and provided for you. Please take as directed and contact our office if you believe you are having problem(s) with the medication(s) or have any questions.  Follow up:  If your symptoms worsen or fail to improve, please contact our office for further instruction, or in case of emergency go directly to the emergency room at the closest medical facility.   DASH Eating Plan DASH stands for "Dietary Approaches to Stop Hypertension." The DASH eating plan is a healthy eating plan that has been shown to reduce high blood pressure (hypertension). Additional health benefits may include reducing the risk of type 2 diabetes mellitus, heart disease, and stroke. The DASH eating plan may also help with weight loss. What do I need to know about the DASH eating plan? For the DASH eating plan, you will follow these general guidelines:  Choose foods with less than 150 milligrams of sodium per serving (as listed on the food label).  Use salt-free seasonings or herbs instead of table salt or sea salt.  Check with your health care provider or pharmacist before using salt substitutes.  Eat lower-sodium products. These are often labeled as "low-sodium" or "no salt added."  Eat fresh foods. Avoid eating a lot of canned foods.  Eat more vegetables, fruits, and low-fat dairy products.  Choose whole grains. Look for the word "whole" as the first word in the ingredient list.  Choose fish and skinless chicken or [place] more often than red meat. Limit fish, poultry, and meat to 6 oz (170 g) each day.  Limit sweets, desserts, sugars, and sugary drinks.  Choose heart-healthy fats.  Eat more home-cooked food and less  restaurant, buffet, and fast food.  Limit fried foods.  Do not fry foods. Cook foods using methods such as baking, boiling, grilling, and broiling instead.  When eating at a restaurant, ask that your food be prepared with less salt, or no salt if possible. What foods can I eat? Seek help from a dietitian for individual calorie needs. Grains  Whole grain or whole wheat bread. Brown rice. Whole grain or whole wheat pasta. Quinoa, bulgur, and whole grain cereals. Low-sodium cereals. Corn or whole wheat flour tortillas. Whole grain cornbread. Whole grain crackers. Low-sodium crackers. Vegetables  Fresh or frozen vegetables (raw, steamed, roasted, or grilled). Low-sodium or reduced-sodium tomato and vegetable juices. Low-sodium or reduced-sodium tomato sauce and paste. Low-sodium or reduced-sodium canned vegetables. Fruits  All fresh, canned (in natural juice), or frozen fruits. Meat and Other Protein Products  Ground beef (85% or leaner), grass-fed beef, or beef trimmed of fat. Skinless chicken or [place]. Ground chicken or [place]. Pork trimmed of fat. All fish and seafood. Eggs. Dried beans, peas, or lentils. Unsalted nuts and seeds. Unsalted canned beans. Dairy  Low-fat dairy products, such as skim or 1% milk, 2% or reduced-fat cheeses, low-fat ricotta or cottage cheese, or plain low-fat yogurt. Low-sodium or reduced-sodium cheeses. Fats and Oils  Tub margarines without trans fats. Light or reduced-fat mayonnaise and salad dressings (reduced sodium). Avocado. Safflower, olive, or canola oils. Natural peanut or almond butter. Other  Unsalted popcorn and pretzels. The items listed above may not be a complete list of recommended foods or beverages. Contact your dietitian for more  options.  What foods are not recommended? Grains  White bread. White pasta. White rice. Refined cornbread. Bagels and croissants. Crackers that contain trans fat. Vegetables  Creamed or fried vegetables. Vegetables in  a cheese sauce. Regular canned vegetables. Regular canned tomato sauce and paste. Regular tomato and vegetable juices. Fruits  Canned fruit in light or heavy syrup. Fruit juice. Meat and Other Protein Products  Fatty cuts of meat. Ribs, chicken wings, bacon, sausage, bologna, salami, chitterlings, fatback, hot dogs, bratwurst, and packaged luncheon meats. Salted nuts and seeds. Canned beans with salt. Dairy  Whole or 2% milk, cream, half-and-half, and cream cheese. Whole-fat or sweetened yogurt. Full-fat cheeses or blue cheese. Nondairy creamers and whipped toppings. Processed cheese, cheese spreads, or cheese curds. Condiments  Onion and garlic salt, seasoned salt, table salt, and sea salt. Canned and packaged gravies. Worcestershire sauce. Tartar sauce. Barbecue sauce. Teriyaki sauce. Soy sauce, including reduced sodium. Steak sauce. Fish sauce. Oyster sauce. Cocktail sauce. Horseradish. Ketchup and mustard. Meat flavorings and tenderizers. Bouillon cubes. Hot sauce. Tabasco sauce. Marinades. Taco seasonings. Relishes. Fats and Oils  Butter, stick margarine, lard, shortening, ghee, and bacon fat. Coconut, palm kernel, or palm oils. Regular salad dressings. Other  Pickles and olives. Salted popcorn and pretzels. The items listed above may not be a complete list of foods and beverages to avoid. Contact your dietitian for more information.  Where can I find more information? National Heart, Lung, and Blood Institute: travelstabloid.com This information is not intended to replace advice given to you by your health care provider. Make sure you discuss any questions you have with your health care provider. Document Released: 10/02/2011 Document Revised: 03/20/2016 Document Reviewed: 08/17/2013 Elsevier Interactive Patient Education  2017 [employer].

## 2016-11-04 NOTE — Assessment & Plan Note (Addendum)
Symptoms and exam consistent with sinusitis with potential for viral infection given most recent improvement. In office injection of depo-medrol provided.  Recommend watchful waiting with written prescription for Augmentin provided if no improvement in the next 3-5 days. Continue over-the-counter medications as needed for symptom relief and supportive care. Follow-up if symptoms worsen or do not improve

## 2016-11-07 ENCOUNTER — Other Ambulatory Visit: Payer: Self-pay | Admitting: Family

## 2016-11-07 MED ORDER — VALACYCLOVIR HCL 1 G PO TABS: 2000.0000 mg | ORAL_TABLET | Freq: Two times a day (BID) | ORAL | 0 refills | Status: DC

## 2016-11-07 MED FILL — valACYclovir HCL 1 GM TABS: 1 | 4 days supply | Qty: 4 | Fill #0

## 2016-12-10 ENCOUNTER — Encounter: Payer: Self-pay | Admitting: Internal Medicine

## 2017-08-25 ENCOUNTER — Other Ambulatory Visit (INDEPENDENT_AMBULATORY_CARE_PROVIDER_SITE_OTHER): Payer: 59

## 2017-08-25 ENCOUNTER — Encounter: Payer: Self-pay | Admitting: Internal Medicine

## 2017-08-25 ENCOUNTER — Ambulatory Visit (INDEPENDENT_AMBULATORY_CARE_PROVIDER_SITE_OTHER): Payer: 59 | Admitting: Internal Medicine

## 2017-08-25 DIAGNOSIS — Z6831 Body mass index (BMI) 31.0-31.9, adult: Secondary | ICD-10-CM

## 2017-08-25 DIAGNOSIS — Z0000 Encounter for general adult medical examination without abnormal findings: Secondary | ICD-10-CM

## 2017-08-25 DIAGNOSIS — R030 Elevated blood-pressure reading, without diagnosis of hypertension: Secondary | ICD-10-CM

## 2017-08-25 DIAGNOSIS — E669 Obesity, unspecified: Secondary | ICD-10-CM | POA: Insufficient documentation

## 2017-08-25 DIAGNOSIS — E6609 Other obesity due to excess calories: Secondary | ICD-10-CM

## 2017-08-25 LAB — CBC WITH DIFFERENTIAL/PLATELET
Basophils Absolute: 0.1 10*3/uL (ref 0.0–0.1)
Basophils Relative: 0.8 % (ref 0.0–3.0)
EOS PCT: 5.4 % — AB (ref 0.0–5.0)
Eosinophils Absolute: 0.4 10*3/uL (ref 0.0–0.7)
HCT: 42.8 % (ref 36.0–46.0)
Hemoglobin: 13.8 g/dL (ref 12.0–15.0)
LYMPHS ABS: 2.4 10*3/uL (ref 0.7–4.0)
Lymphocytes Relative: 30.9 % (ref 12.0–46.0)
MCHC: 32.3 g/dL (ref 30.0–36.0)
MCV: 78.3 fl (ref 78.0–100.0)
MONO ABS: 0.5 10*3/uL (ref 0.1–1.0)
Monocytes Relative: 6.1 % (ref 3.0–12.0)
Neutro Abs: 4.5 10*3/uL (ref 1.4–7.7)
Neutrophils Relative %: 56.8 % (ref 43.0–77.0)
Platelets: 354 10*3/uL (ref 150.0–400.0)
RBC: 5.47 Mil/uL — AB (ref 3.87–5.11)
RDW: 13.5 % (ref 11.5–15.5)
WBC: 7.9 10*3/uL (ref 4.0–10.5)

## 2017-08-25 LAB — URINALYSIS, ROUTINE W REFLEX MICROSCOPIC
Bilirubin Urine: NEGATIVE
Ketones, ur: NEGATIVE
Nitrite: NEGATIVE
PH: 5.5 (ref 5.0–8.0)
SPECIFIC GRAVITY, URINE: 1.025 (ref 1.000–1.030)
TOTAL PROTEIN, URINE-UPE24: NEGATIVE
URINE GLUCOSE: NEGATIVE
UROBILINOGEN UA: 0.2 (ref 0.0–1.0)

## 2017-08-25 LAB — LIPID PANEL
CHOL/HDL RATIO: 4
Cholesterol: 214 mg/dL — ABNORMAL HIGH (ref 0–200)
HDL: 57.5 mg/dL (ref >39.00)
LDL CALC: 136 mg/dL — AB (ref 0–99)
NONHDL: 156.56
Triglycerides: 104 mg/dL (ref 0.0–149.0)
VLDL: 20.8 mg/dL (ref 0.0–40.0)

## 2017-08-25 LAB — HEPATIC FUNCTION PANEL
ALBUMIN: 4.5 g/dL (ref 3.5–5.2)
ALK PHOS: 35 U/L — AB (ref 39–117)
ALT: 13 U/L (ref 0–35)
AST: 13 U/L (ref 0–37)
BILIRUBIN TOTAL: 0.5 mg/dL (ref 0.2–1.2)
Bilirubin, Direct: 0.1 mg/dL (ref 0.0–0.3)
TOTAL PROTEIN: 7.8 g/dL (ref 6.0–8.3)

## 2017-08-25 LAB — BASIC METABOLIC PANEL
BUN: 12 mg/dL (ref 6–23)
CALCIUM: 9.8 mg/dL (ref 8.4–10.5)
CO2: 26 mEq/L (ref 19–32)
Chloride: 102 mEq/L (ref 96–112)
Creatinine, Ser: 0.65 mg/dL (ref 0.40–1.20)
GFR: 113.25 mL/min (ref >60.00)
Glucose, Bld: 99 mg/dL (ref 70–99)
Potassium: 4.1 mEq/L (ref 3.5–5.1)
SODIUM: 139 meq/L (ref 135–145)

## 2017-08-25 LAB — TSH: TSH: 1.11 u[IU]/mL (ref 0.35–4.50)

## 2017-08-25 MED ORDER — MULTIPLE VITAMINS/WOMENS PO TABS: 1.0000 | ORAL_TABLET | Freq: Every day | ORAL | 3 refills | Status: AC

## 2017-08-25 MED ORDER — VITAMIN D3 50 MCG (2000 UT) PO CAPS: 2000.0000 [IU] | ORAL_CAPSULE | Freq: Every day | ORAL | 3 refills | Status: AC

## 2017-08-25 NOTE — Patient Instructions (Addendum)
Cooking With Less Salt Cooking with less salt is one way to reduce the amount of sodium you get from food. Depending on your condition and overall health, your health care provider or diet and nutrition specialist (dietitian) may recommend that you reduce your sodium intake. Most people should have less than 2,300 milligrams (mg) of sodium each day. If you have high blood pressure (hypertension), you may need to limit your sodium to 1,500 mg each day. Follow the tips below to help reduce your sodium intake. What do I need to know about cooking with less salt? Shopping  Buy sodium-free or low-sodium products. Look for the following words on food labels: ? Low-sodium. ? Sodium-free. ? Reduced-sodium. ? No salt added. ? Unsalted.  Buy fresh or frozen vegetables. Avoid canned vegetables.  Avoid buying meats or protein foods that have been injected with broth or saline solution.  Avoid cured or smoked meats, such as hot dogs, bacon, salami, ham, and bologna. Reading food labels  Check the food label before buying or using packaged ingredients.  Look for products with no more than 140 mg of sodium in one serving.  Do not choose foods with salt as one of the first three ingredients on the ingredients list. If salt is one of the first three ingredients, it usually means the item is high in sodium, because ingredients are listed in order of amount in the food item. Cooking  Use herbs, seasonings without salt, and spices as substitutes for salt in foods.  Use sodium-free baking soda when baking.  Grill, braise, or roast foods to add flavor with less salt.  Avoid adding salt to pasta, rice, or hot cereals while cooking.  Drain and rinse canned vegetables before use.  Avoid adding salt when cooking sweets and desserts.  Cook with low-sodium ingredients. What are some salt alternatives? The following are herbs, seasonings, and spices that can be used instead of salt to give taste to your  food. Herbs should be fresh or dried. Do not choose packaged mixes. Next to the name of the herb, spice, or seasoning are some examples of foods you can pair it with. Herbs  Bay leaves - Soups, meat and vegetable dishes, and spaghetti sauce.  Basil - [employer], soups, pasta, and fish dishes.  Cilantro - Meat, poultry, and vegetable dishes.  Chili powder - Marinades and Mexican dishes.  Chives - Salad dressings and potato dishes.  Cumin - Mexican dishes, couscous, and meat dishes.  Dill - Fish dishes, sauces, and salads.  Fennel - Meat and vegetable dishes, breads, and cookies.  Garlic (do not use garlic salt) - [place] dishes, meat dishes, salad dressings, and sauces.  Marjoram - Soups, potato dishes, and meat dishes.  Oregano - Pizza and spaghetti sauce.  Parsley - Salads, soups, pasta, and meat dishes.  Rosemary - [place] dishes, salad dressings, soups, and red meats.  Saffron - Fish dishes, pasta, and some poultry dishes.  Sage - Stuffings and sauces.  Tarragon - Fish and [employer].  Thyme - Stuffing, meat, and fish dishes. Seasonings  Lemon juice - Fish dishes, poultry dishes, vegetables, and salads.  Vinegar - Salad dressings, vegetables, and fish dishes. Spices  Cinnamon - Sweet dishes, such as cakes, cookies, and puddings.  Cloves - Gingerbread, puddings, and marinades for meats.  Curry - Vegetable dishes, fish and poultry dishes, and stir-fry dishes.  Ginger - Vegetables dishes, fish dishes, and stir-fry dishes.  Nutmeg - Pasta, vegetables, poultry, fish dishes, and custard. What  dishes, such as cakes, cookies, and puddings.   Cloves - Gingerbread, puddings, and marinades for meats.   Curry - Vegetable dishes, fish and poultry dishes, and stir-fry dishes.   Ginger - Vegetables dishes, fish dishes, and stir-fry dishes.   Nutmeg - Pasta, vegetables, poultry, fish dishes, and custard.  What are some low-sodium ingredients and foods?   Fresh or frozen fruits and vegetables with no sauce added.   Fresh or frozen whole meats, poultry, and fish with no sauce added.   Eggs.   Noodles, pasta, quinoa, rice.   Shredded or puffed wheat or puffed rice.   Regular or quick oats.   Milk, yogurt, hard cheeses, and low-sodium cheeses. Good cheese choices include  Swiss, Monterey Jack, and mozzarella. Always check the label for the serving size and sodium content.   Unsalted butter or margarine.   Unsalted nuts.   Sherbet or ice cream (keep to  cup per serving).   Homemade pudding.   Sodium-free baking soda and baking powder.  This is not a complete list of low-sodium ingredients and foods. Contact your dietitian for more options.  Summary   Cooking with less salt is one way to reduce the amount of sodium that you get from food.   Buy sodium-free or low-sodium products.   Check the food label before using or buying packaged ingredients.   Use herbs, seasonings without salt, and spices as substitutes for salt in foods.  This information is not intended to replace advice given to you by your health care provider. Make sure you discuss any questions you have with your health care provider.  Document Released: 10/13/2005 Document Revised: 10/21/2016 Document Reviewed: 10/21/2016  Elsevier Interactive Patient Education  2017 Elsevier Inc.

## 2017-08-25 NOTE — Assessment & Plan Note (Signed)
Discussed Will ref to Dr Leafy Ro

## 2017-08-25 NOTE — Progress Notes (Signed)
Subjective:  Patient ID: Dawn Mathis, female    DOB: [birth year]  Age: 31 y.o. MRN: [id number]  CC: No chief complaint on file.   HPI Nicholette Loughry presents for a well exam C/o wt gain - on diet now; elevated BP. Not taking amlodipine  Outpatient Medications Prior to Visit  Medication Sig Dispense Refill  . amLODipine (NORVASC) 10 MG tablet Take 0.5 tablets (5 mg total) by mouth daily. (Patient not taking: Reported on 08/25/2017) 30 tablet 0  . amoxicillin-clavulanate (AUGMENTIN) 875-125 MG tablet Take 1 tablet by mouth 2 (two) times daily. 14 tablet 0  . promethazine-codeine (PHENERGAN WITH CODEINE) 6.25-10 MG/5ML syrup Take 5 mLs by mouth every 6 (six) hours as needed for cough. 120 mL 0  . valACYclovir (VALTREX) 1000 MG tablet Take 2 tablets (2,000 mg total) by mouth 2 (two) times daily. 4 tablet 0   No facility-administered medications prior to visit.     ROS Review of Systems  Constitutional: Negative for activity change, appetite change, chills, fatigue and unexpected weight change.  HENT: Negative for congestion, mouth sores and sinus pressure.   Eyes: Negative for visual disturbance.  Respiratory: Negative for cough and chest tightness.   Gastrointestinal: Negative for abdominal pain and nausea.  Genitourinary: Negative for difficulty urinating, frequency and vaginal pain.  Musculoskeletal: Negative for back pain and gait problem.  Skin: Negative for pallor and rash.  Neurological: Negative for dizziness, tremors, weakness, numbness and headaches.  Psychiatric/Behavioral: Negative for confusion, sleep disturbance and suicidal ideas.    Objective:  BP (!) 138/92 (BP Location: Left Arm, Patient Position: Sitting, Cuff Size: Large)   Pulse 98   Temp 98.6 F (37 C) (Oral)   Ht 5\' 2"  (1.575 m)   Wt 173 lb (78.5 kg)   LMP 08/20/2017   SpO2 99%   BMI 31.64 kg/m   BP Readings from Last 3 Encounters:  08/25/17 (!) 138/92  11/04/16 (!) 194/120  12/30/11  118/70    Wt Readings from Last 3 Encounters:  08/25/17 173 lb (78.5 kg)  11/04/16 177 lb 9.6 oz (80.6 kg)  06/19/11 143 lb (64.9 kg)    Physical Exam  Constitutional: She appears well-developed. No distress.  HENT:  Head: Normocephalic.  Right Ear: External ear normal.  Left Ear: External ear normal.  Nose: Nose normal.  Mouth/Throat: Oropharynx is clear and moist.  Eyes: Pupils are equal, round, and reactive to light. Conjunctivae are normal. Right eye exhibits no discharge. Left eye exhibits no discharge.  Neck: Normal range of motion. Neck supple. No JVD present. No tracheal deviation present. No thyromegaly present.  Cardiovascular: Normal rate, regular rhythm and normal heart sounds.   Pulmonary/Chest: No stridor. No respiratory distress. She has no wheezes.  Abdominal: Soft. Bowel sounds are normal. She exhibits no distension and no mass. There is no tenderness. There is no rebound and no guarding.  Musculoskeletal: She exhibits no edema or tenderness.  Lymphadenopathy:    She has no cervical adenopathy.  Neurological: She displays normal reflexes. No cranial nerve deficit. She exhibits normal muscle tone. Coordination normal.  Skin: No rash noted. No erythema.  Psychiatric: She has a normal mood and affect. Her behavior is normal. Judgment and thought content normal.    Lab Results  Component Value Date   WBC 11.5 (H) 10/25/2013   HGB 13.7 10/25/2013   HCT 41.2 10/25/2013   PLT 336.0 10/25/2013   GLUCOSE 88 10/25/2013   CHOL 191 10/25/2013   TRIG 181.0 (H) 10/25/2013  HDL 42.80 10/25/2013   LDLCALC 112 (H) 10/25/2013   ALT 21 10/25/2013   AST 25 10/25/2013   NA 138 10/25/2013   K 4.1 10/25/2013   CL 106 10/25/2013   CREATININE 0.7 10/25/2013   BUN 12 10/25/2013   CO2 25 10/25/2013   TSH 2.26 10/25/2013    Dg Chest 2 View  Result Date: 01/20/2012 *RADIOLOGY REPORT* Clinical Data: Screening for TB CHEST - 2 VIEW Comparison: 12/31/2007 Findings: The heart  size and mediastinal contours are within normal limits.  Both lungs are clear.  The visualized skeletal structures are unremarkable. IMPRESSION: Negative exam. Original Report Authenticated By: Angelita Ingles, M.D.   Assessment & Plan:   There are no diagnoses linked to this encounter. I have discontinued Ms. Skalsky's promethazine-codeine, amoxicillin-clavulanate, and valACYclovir. I am also having her maintain her amLODipine.  No orders of the defined types were placed in this encounter.    Follow-up: No Follow-up on file.  Walker Kehr, MD

## 2017-08-25 NOTE — Assessment & Plan Note (Signed)
We discussed age appropriate health related issues, including available/recomended screening tests and vaccinations. We discussed a need for adhering to healthy diet and exercise. Labs were ordered to be later reviewed . All questions were answered.   

## 2017-08-25 NOTE — Assessment & Plan Note (Signed)
Not taking meds Labs NAS

## 2017-08-26 ENCOUNTER — Encounter: Payer: 59 | Admitting: Internal Medicine

## 2017-12-29 DIAGNOSIS — Z01419 Encounter for gynecological examination (general) (routine) without abnormal findings: Secondary | ICD-10-CM | POA: Diagnosis not present

## 2017-12-29 DIAGNOSIS — Z6831 Body mass index (BMI) 31.0-31.9, adult: Secondary | ICD-10-CM | POA: Diagnosis not present

## 2018-03-26 ENCOUNTER — Telehealth: Payer: Self-pay | Admitting: Family Medicine

## 2018-03-26 MED ORDER — ACYCLOVIR 800 MG PO TABS
800.0000 mg | ORAL_TABLET | Freq: Two times a day (BID) | ORAL | 3 refills | Status: AC
Start: 2018-03-26 — End: ?

## 2018-03-26 MED FILL — ACYCLOVIR 800 MG TABLET: 800 | 10 days supply | Qty: 20 | Fill #0

## 2018-03-26 NOTE — Telephone Encounter (Signed)
Acyclovir provided   Rosemarie Ax, MD [facility] 03/26/2018, 2:55 PM

## 2018-03-26 NOTE — Telephone Encounter (Signed)
Patient has a cold sore.  Is requesting last medication prescribed for this by Terri Piedra.  Would like to know if Raeford Razor can call in to [facility] while Plotnikov is out of the office.

## 2018-03-30 DIAGNOSIS — H5213 Myopia, bilateral: Secondary | ICD-10-CM | POA: Diagnosis not present

## 2018-11-25 MED FILL — ACYCLOVIR 800 MG TABLET: 800 | 10 days supply | Qty: 20 | Fill #1

## 2019-02-17 DIAGNOSIS — L80 Vitiligo: Secondary | ICD-10-CM | POA: Diagnosis not present

## 2019-02-17 MED FILL — TACROLIMUS 0.1 % OINT: 0.1 | 30 days supply | Qty: 100 | Fill #0

## 2019-03-15 MED FILL — TACROLIMUS 0.1 % OINT: 0.1 | 30 days supply | Qty: 100 | Fill #1

## 2019-03-22 MED FILL — ACYCLOVIR 800 MG TABLET: 800 | 10 days supply | Qty: 20 | Fill #2

## 2019-07-07 DIAGNOSIS — D1801 Hemangioma of skin and subcutaneous tissue: Secondary | ICD-10-CM | POA: Diagnosis not present

## 2019-07-07 DIAGNOSIS — L80 Vitiligo: Secondary | ICD-10-CM | POA: Diagnosis not present

## 2019-07-07 MED FILL — TACROLIMUS 0.1 % OINT: 0.1 | 14 days supply | Qty: 90 | Fill #0

## 2019-07-07 MED FILL — FLUOCINONIDE 0.05% OINTMENT: 0.05 | 14 days supply | Qty: 120 | Fill #0

## 2019-09-02 DIAGNOSIS — L80 Vitiligo: Secondary | ICD-10-CM | POA: Diagnosis not present

## 2020-01-06 DIAGNOSIS — H5213 Myopia, bilateral: Secondary | ICD-10-CM | POA: Diagnosis not present

## 2020-02-21 MED FILL — FLUOCINONIDE 0.05% OINTMENT: 0.05 | 14 days supply | Qty: 120 | Fill #1

## 2020-02-21 MED FILL — TACROLIMUS 0.1 % OINT: 0.1 | 14 days supply | Qty: 90 | Fill #1
